# Patient Record
Sex: Male | Born: 1980 | Race: White | Hispanic: No | Marital: Married | Smoking: Never smoker
Health system: Southern US, Community
[De-identification: ages and names within clinical notes are randomized; demographics above are authoritative.]

---

## 2004-01-27 ENCOUNTER — Emergency Department: Payer: Self-pay | Admitting: Emergency Medicine

## 2004-11-17 ENCOUNTER — Emergency Department: Payer: Self-pay | Admitting: Emergency Medicine

## 2004-11-17 ENCOUNTER — Other Ambulatory Visit: Payer: Self-pay

## 2006-07-13 IMAGING — CR DG CHEST 2V
1 series · 2 of 2 positions shown · non-contrast
Comparison: none

REASON FOR EXAM: Chest pain
COMMENTS:

[Series 1863: postero_anterior · 0.11mm/px · 2 of 2 slices shown]
[im 1/2]
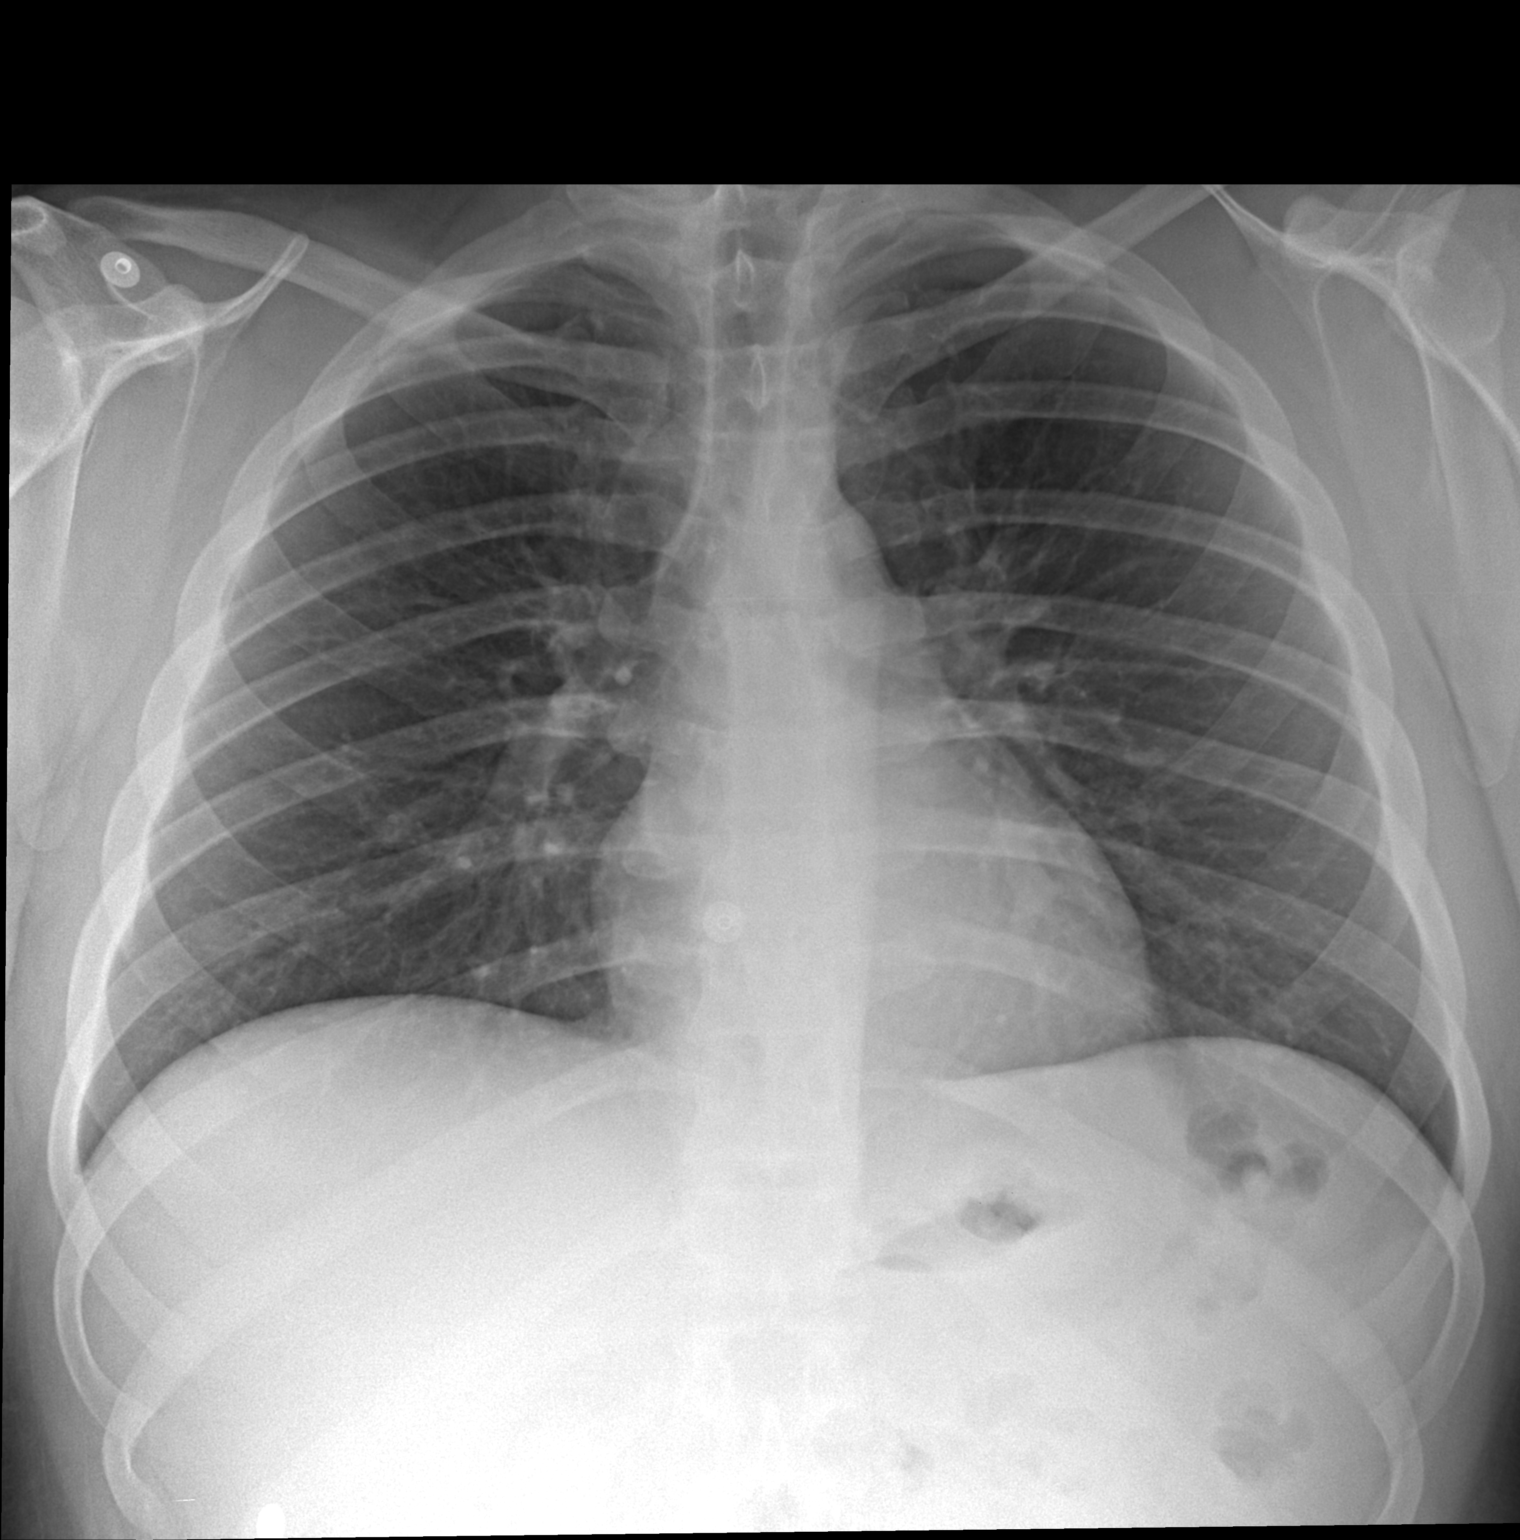
[im 2/2]
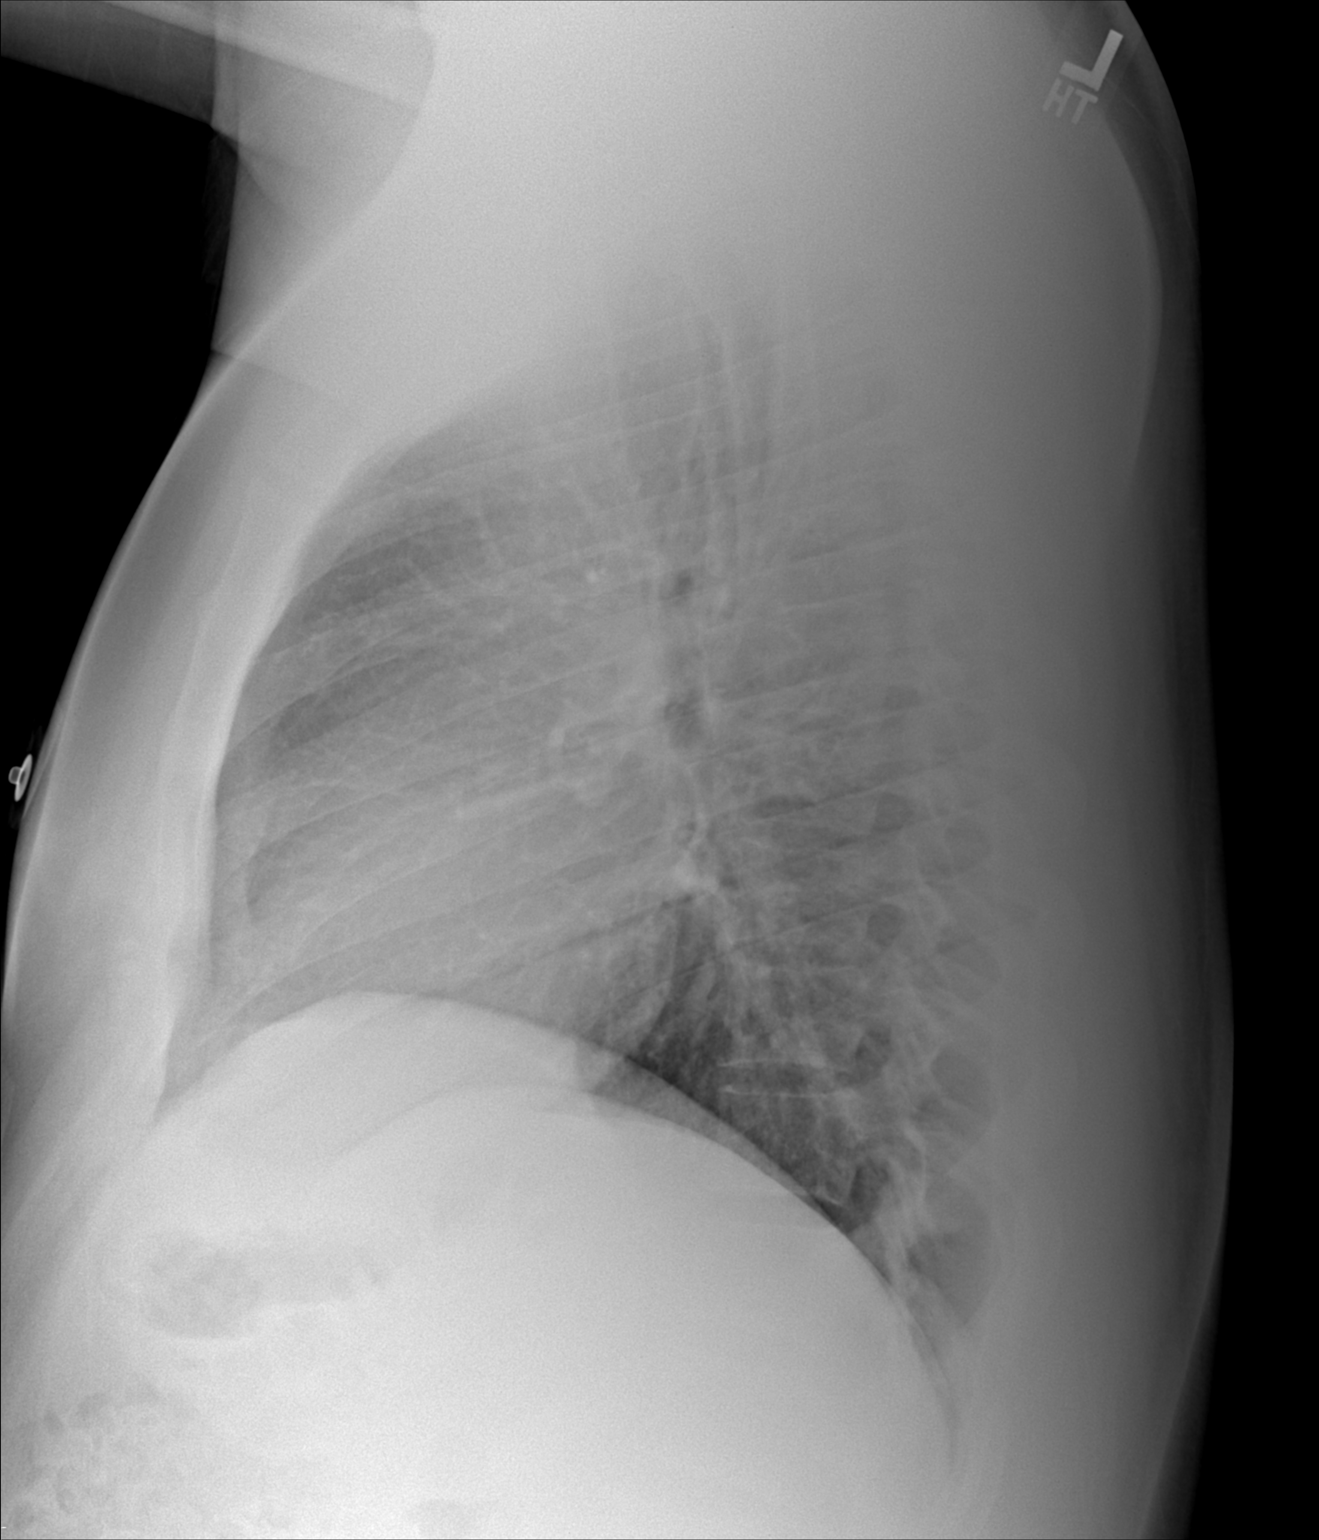

[2 of 2 positions shown; findings below may reference images not displayed]

PROCEDURE:     DXR - DXR CHEST PA (OR AP) AND LATERAL  - November 17, 2004  [DATE]

RESULT:        The patient is complaining of dyspnea and chest discomfort.

The lungs are adequately inflated.  There is no focal infiltrate.  The heart
is not enlarged and the pulmonary vascularity is not engorged.  There is no
pleural effusion.
IMPRESSION: I do not see evidence of acute cardiopulmonary disease.

## 2011-06-08 ENCOUNTER — Ambulatory Visit: Payer: Self-pay | Admitting: Family Medicine

## 2012-09-06 ENCOUNTER — Ambulatory Visit: Payer: Self-pay | Admitting: Emergency Medicine

## 2017-12-25 ENCOUNTER — Ambulatory Visit
Admission: EM | Admit: 2017-12-25 | Discharge: 2017-12-25 | Disposition: A | Discharge status: Home / Self Care | Payer: BLUE CROSS/BLUE SHIELD | Attending: Family Medicine | Admitting: Family Medicine

## 2017-12-25 ENCOUNTER — Other Ambulatory Visit: Payer: Self-pay

## 2017-12-25 ENCOUNTER — Encounter: Payer: Self-pay | Admitting: Emergency Medicine

## 2017-12-25 DIAGNOSIS — R112 Nausea with vomiting, unspecified: Secondary | ICD-10-CM

## 2017-12-25 DIAGNOSIS — R109 Unspecified abdominal pain: Secondary | ICD-10-CM

## 2017-12-25 LAB — CBC WITH DIFFERENTIAL/PLATELET
Abs Immature Granulocytes: 0.06 10*3/uL (ref 0.00–0.07)
Basophils Absolute: 0 10*3/uL (ref 0.0–0.1)
Basophils Relative: 0 %
EOS ABS: 0 10*3/uL (ref 0.0–0.5)
EOS PCT: 0 %
HEMATOCRIT: 45.5 % (ref 39.0–52.0)
HEMOGLOBIN: 14.5 g/dL (ref 13.0–17.0)
IMMATURE GRANULOCYTES: 1 %
Lymphocytes Relative: 18 %
Lymphs Abs: 1.7 10*3/uL (ref 0.7–4.0)
MCH: 26 pg (ref 26.0–34.0)
MCHC: 31.9 g/dL (ref 30.0–36.0)
MCV: 81.7 fL (ref 80.0–100.0)
Monocytes Absolute: 1.4 10*3/uL — ABNORMAL HIGH (ref 0.1–1.0)
Monocytes Relative: 14 %
NEUTROS PCT: 67 %
NRBC: 0 % (ref 0.0–0.2)
Neutro Abs: 6.4 10*3/uL (ref 1.7–7.7)
Platelets: 245 10*3/uL (ref 150–400)
RBC: 5.57 MIL/uL (ref 4.22–5.81)
RDW: 13.8 % (ref 11.5–15.5)
WBC: 9.5 10*3/uL (ref 4.0–10.5)

## 2017-12-25 LAB — BASIC METABOLIC PANEL
Anion gap: 10 (ref 5–15)
BUN: 9 mg/dL (ref 6–20)
CHLORIDE: 97 mmol/L — AB (ref 98–111)
CO2: 28 mmol/L (ref 22–32)
Calcium: 9.1 mg/dL (ref 8.9–10.3)
Creatinine, Ser: 1 mg/dL (ref 0.61–1.24)
GFR calc Af Amer: >60 mL/min (ref >60)
GFR calc non Af Amer: >60 mL/min (ref >60)
Glucose, Bld: 114 mg/dL — ABNORMAL HIGH (ref 70–99)
Potassium: 4 mmol/L (ref 3.5–5.1)
Sodium: 135 mmol/L (ref 135–145)

## 2017-12-25 LAB — RAPID INFLUENZA A&B ANTIGENS (ARMC ONLY): INFLUENZA B (ARMC): NEGATIVE

## 2017-12-25 LAB — RAPID INFLUENZA A&B ANTIGENS: Influenza A (ARMC): NEGATIVE

## 2017-12-25 MED ORDER — ONDANSETRON 8 MG PO TBDP
8.0000 mg | ORAL_TABLET | Freq: Once | ORAL | Status: AC
  Administered 2017-12-25: 8 mg via ORAL

## 2017-12-25 MED ORDER — ONDANSETRON 4 MG PO TBDP: 4.0000 mg | ORAL_TABLET | Freq: Three times a day (TID) | ORAL | 0 refills | Status: AC | PRN

## 2017-12-25 NOTE — ED Provider Notes (Signed)
[facility] ____________________________________________  Time seen: Approximately 2:46 PM  I have reviewed the triage vital signs and the nursing notes.   HISTORY  Chief Complaint Nausea and Emesis  HPI Jon Flynn is a 37 y.o. male presenting for evaluation of fever, nausea, vomiting and abdominal discomfort.  Patient reports this past Saturday and Sunday he was having chills and a fever with T-max 101.7.  States at that time he was pretty much just laying around on the couch, not eating or drinking very much.  States his stomach did not feel "great at the time "but was not bothering him.  States yesterday he started to feel a little bit better and went to work.  States he was able to stay at work but overall felt very tired.  States today after being outside for work he felt nauseous and then had 2 episodes of vomiting.  States some diffuse abdominal discomfort, but states very mild.  Denies fever today.  Over the weekend was taking Tylenol and ibuprofen, no over-the-counter medication taken today prior to arrival.  Decreased appetite, but is overall continue to drink fluids well.  States minimal nasal congestion.  No cough or sore throat.  No accompanying dysuria, chest pain, shortness of breath.  States his daughter last week had a GI bug with fever as well.  States no diarrhea.  Last bowel movement was yesterday and described as normal.  Denies abnormal color or blood in stool or vomit.  Denies known food triggers.  Denies recent changes.  Reports otherwise doing well.  Denies chronic medical problems.   History reviewed. No pertinent past medical history. Denies   There are no active problems to display for this patient.   History reviewed. No pertinent surgical history.   No current facility-administered medications for this encounter.   Current Outpatient Medications:  .  ondansetron (ZOFRAN ODT) 4 MG disintegrating tablet, Take 1 tablet (4 mg total) by mouth  every 8 (eight) hours as needed for nausea or vomiting., Disp: 15 tablet, Rfl: 0  Allergies Patient has no known allergies.  Family History  Problem Relation Age of Onset  . Healthy Mother   . Heart attack Father     Social History Social History   Tobacco Use  . Smoking status: Never Smoker  . Smokeless tobacco: Never Used  Substance Use Topics  . Alcohol use: Yes  . Drug use: Never    Review of Systems Constitutional:positive fever. ENT: No sore throat. Cardiovascular: Denies chest pain. Respiratory: Denies shortness of breath. Gastrointestinal: As above.  Genitourinary: Negative for dysuria. Musculoskeletal: Negative for back pain. Skin: Negative for rash.   ____________________________________________   PHYSICAL EXAM:  VITAL SIGNS: ED Triage Vitals  Enc Vitals Group     BP 12/25/17 1427 127/80     Pulse Rate 12/25/17 1427 (!) 104     Resp 12/25/17 1427 16     Temp 12/25/17 1427 98.4 F (36.9 C)     Temp Source 12/25/17 1427 Oral     SpO2 12/25/17 1427 98 %     Weight 12/25/17 1424 220 lb (99.8 kg)     Height 12/25/17 1424 5\' 10"  (1.778 m)     Head Circumference --      Peak Flow --      Pain Score 12/25/17 1423 0     Pain Loc --      Pain Edu? --      Excl. in GC? --     Constitutional:  Alert and oriented. Well appearing and in no acute distress. ENT      Head: Normocephalic and atraumatic.      Nose: No congestion      Mouth/Throat: Mucous membranes are moist.Oropharynx non-erythematous.  No tonsillar swelling or exudate. Neck: No stridor. Supple without meningismus.  Hematological/Lymphatic/Immunilogical: No cervical lymphadenopathy. Cardiovascular: Normal rate, regular rhythm. Grossly normal heart sounds.  Good peripheral circulation. Respiratory: Normal respiratory effort without tachypnea nor retractions. Breath sounds are clear and equal bilaterally. No wheezes, rales, rhonchi. Gastrointestinal:  Normal Bowel sounds. No CVA tenderness. Mild  midline lower abdomen, left lower quadrant and right upper quadrant tenderness palpation.  Non-guarding.  No clear point tenderness. Musculoskeletal:  No midline cervical, thoracic or lumbar tenderness to palpation. Neurologic:  Normal speech and language.Speech is normal. No gait instability.  Skin:  Skin is warm, dry and intact. No rash noted. Psychiatric: Mood and affect are normal. Speech and behavior are normal. Patient exhibits appropriate insight and judgment   ___________________________________________   LABS (all labs ordered are listed, but only abnormal results are displayed)  Labs Reviewed  CBC WITH DIFFERENTIAL/PLATELET - Abnormal; Notable for the following components:      Result Value   Monocytes Absolute 1.4 (*)    All other components within normal limits  BASIC METABOLIC PANEL - Abnormal; Notable for the following components:   Chloride 97 (*)    Glucose, Bld 114 (*)    All other components within normal limits  RAPID INFLUENZA A&B ANTIGENS ([facility] ONLY)    RADIOLOGY  No results found. ____________________________________________   PROCEDURES Procedures    INITIAL IMPRESSION / ASSESSMENT AND PLAN / ED COURSE  Pertinent labs & imaging results that were available during my care of the patient were reviewed by me and considered in my medical decision making (see chart for details).  Overall well-appearing patient.  Patient with recent fever, nausea vomiting.  8 mg ODT Zofran given once.  Will evaluate labs as well as influenza.  Labs reviewed and discussed results with patient.  Influenza negative.  Labs overall unremarkable.  After Zofran, patient reevaluated, and reports feeling better.  Abdomen reexamined and minimal diffuse lower abdominal tenderness, no point tenderness.  Suspect viral illness.  Encourage rest, fluids, supportive care, bland diet, PRN Zofran as needed.  Work note given for today and tomorrow.  Discussed very strict follow-up and return  parameters. Discussed indication, risks and benefits of medications with patient.  Discussed follow up with Primary care physician this week. Discussed follow up and return parameters including no resolution or any worsening concerns. Patient verbalized understanding and agreed to plan.   ____________________________________________   FINAL CLINICAL IMPRESSION(S) / ED DIAGNOSES  Final diagnoses:  Non-intractable vomiting with nausea, unspecified vomiting type  Abdominal pain, unspecified abdominal location     ED Discharge Orders         Ordered    ondansetron (ZOFRAN ODT) 4 MG disintegrating tablet  Every 8 hours PRN     12/25/17 1526           Note: This dictation was prepared with Dragon dictation along with smaller phrase technology. Any transcriptional errors that result from this process are unintentional.         Renford Dills, NP 12/25/17 1533

## 2017-12-25 NOTE — Discharge Instructions (Addendum)
Take medication as prescribed. Rest. Drink plenty of fluids. Bland diet.Continue to monitor as discussed.  Follow up with your primary care physician this week as needed. Return to Urgent care for new or worsening concerns.

## 2017-12-25 NOTE — ED Triage Notes (Signed)
Patient states that he has had some nausea and vomiting that started today while he was at work.  Patient states that prior to this over the weekend he had fever and chills that started over the weekend.
